# Patient Record
Sex: Male | Born: 1970
Health system: Southern US, Community
[De-identification: ages and names within clinical notes are randomized; demographics above are authoritative.]

## PROBLEM LIST (undated history)

## (undated) DIAGNOSIS — E785 Hyperlipidemia, unspecified: Secondary | ICD-10-CM

## (undated) HISTORY — DX: Hyperlipidemia, unspecified: E78.5

---

## 1998-11-30 ENCOUNTER — Other Ambulatory Visit: Admission: RE | Admit: 1998-11-30 | Discharge: 1998-11-30 | Payer: Self-pay | Admitting: Otolaryngology

## 1998-11-30 ENCOUNTER — Encounter (INDEPENDENT_AMBULATORY_CARE_PROVIDER_SITE_OTHER): Payer: Self-pay | Admitting: Specialist

## 2007-07-18 ENCOUNTER — Emergency Department (HOSPITAL_COMMUNITY): Admission: EM | Admit: 2007-07-18 | Discharge: 2007-07-19 | Payer: Self-pay | Admitting: Emergency Medicine

## 2010-06-16 ENCOUNTER — Other Ambulatory Visit (INDEPENDENT_AMBULATORY_CARE_PROVIDER_SITE_OTHER): Payer: Self-pay | Admitting: Internal Medicine

## 2010-06-16 ENCOUNTER — Encounter (HOSPITAL_BASED_OUTPATIENT_CLINIC_OR_DEPARTMENT_OTHER): Payer: BC Managed Care – PPO | Admitting: Internal Medicine

## 2010-06-16 ENCOUNTER — Ambulatory Visit (HOSPITAL_COMMUNITY)
Admission: RE | Admit: 2010-06-16 | Discharge: 2010-06-16 | Disposition: A | Discharge status: Home / Self Care | Payer: BC Managed Care – PPO | Source: Ambulatory Visit | Attending: Internal Medicine | Admitting: Internal Medicine

## 2010-06-16 DIAGNOSIS — K5120 Ulcerative (chronic) proctitis without complications: Secondary | ICD-10-CM

## 2010-06-16 DIAGNOSIS — K921 Melena: Secondary | ICD-10-CM | POA: Insufficient documentation

## 2010-06-16 DIAGNOSIS — K625 Hemorrhage of anus and rectum: Secondary | ICD-10-CM

## 2010-06-16 DIAGNOSIS — K5289 Other specified noninfective gastroenteritis and colitis: Secondary | ICD-10-CM | POA: Insufficient documentation

## 2010-06-20 ENCOUNTER — Ambulatory Visit (INDEPENDENT_AMBULATORY_CARE_PROVIDER_SITE_OTHER): Payer: Self-pay | Admitting: Internal Medicine

## 2010-07-09 NOTE — Op Note (Signed)
  NAME:  Darrell Morales, Darrell Morales             ACCOUNT NO.:  [id number]  MEDICAL RECORD NO.:  [id number]           PATIENT TYPE:  O  LOCATION:  [place]                          FACILITY:  [facility]  PHYSICIAN:  Lionel December, M.D.    DATE OF BIRTH:  1971-04-11  DATE OF PROCEDURE:  06/16/2010 DATE OF DISCHARGE:                              OPERATIVE REPORT   PROCEDURE:  Colonoscopy with terminal ileoscopy.  INDICATION:  [name] is a 40 year old Caucasian male who has been noticing blood in his bowel movements for about 4 weeks.  Lately he has passed large amount of blood.  He has had some diarrhea.  He denies anorexia, weight loss, or abdominal pain.  He does not take any NSAIDs.  Family history is negative for inflammatory bowel disease.  Procedure risks were reviewed with the patient and informed consent was obtained.  MEDICATIONS FOR CONSCIOUS SEDATION:  Demerol 50 mg IV and Versed 5 mg IV.  FINDINGS:  Procedure performed in endoscopy suite.  The patient's vital signs and O2 sat were monitored during the procedure and remained stable.  The patient was placed in left lateral recumbent position. Rectal examination was performed.  No abnormality noted on external or digital exam.  Pentax videoscope was placed through rectum where mucosa was removed noted to be diffusely abnormal with loss of vascularity, erythema, ulcers, and erosions.  There was a virtual complete involvement of rectal mucosa with a clear demarcation just below the rectosigmoid junction.  Scope was advanced into sigmoid colon and beyond.  Rest of the mucosa was normal.  Preparation was excellent. Pictures taken of appendiceal orifice and ileocecal valve.  Short segment of GI was also examined and was normal.  As the scope was withdrawn, colonic mucosa was once again carefully examined and only abnormality was involving the rectal mucosa.  Scope was retroflexed to examine anorectal junction which was unremarkable.  Multiple  biopsies were taken for routine histology from rectal mucosa and endoscope was withdrawn.  Withdrawal time was 10 minutes.  The patient tolerated the procedure well.  FINAL DIAGNOSES: 1. Ulcerative proctitis. 2. Normal terminal ileum.  RECOMMENDATIONS: 1. Pentasa suppository 1 g per rectum at bedtime.  Prescription given     for 30 with 5 refills. 2. I will be contacting the patient with results of biopsy and plan to     see him in the office in 4 weeks at which time we will discuss     whether or not he should be on oral mesalamine.     Lionel December, M.D.     NR/MEDQ  D:  06/16/2010  T:  06/16/2010  Job:  [id number]  cc:   Selinda Flavin Fax: [phone]  Electronically Signed by Lionel December M.D. on 07/09/2010 01:17:32 PM

## 2010-07-18 ENCOUNTER — Ambulatory Visit (INDEPENDENT_AMBULATORY_CARE_PROVIDER_SITE_OTHER): Payer: BC Managed Care – PPO | Admitting: Internal Medicine

## 2010-07-18 DIAGNOSIS — K6289 Other specified diseases of anus and rectum: Secondary | ICD-10-CM

## 2010-07-31 NOTE — Consult Note (Signed)
  NAME:  Darrell Morales, Darrell Morales             ACCOUNT NO.:  [id number]  MEDICAL RECORD NO.:  [id number]           PATIENT TYPE: AMB.  LOCATION: [place].                   [facility]  PHYSICIAN:  Lionel December, M.D.    DATE OF BIRTH:  Sep 23, 1970  DATE :  07/18/2010                                OFFICE VISIT.   PRESENTING COMPLAINT:  Followup for ulcerative proctitis.  SUBJECTIVE:  Darrell Morales is a 40 year old Caucasian male patient of Dr. Selinda Flavin who underwent colonoscopy on June 16, 2010, does have 82-month history of rectal bleeding and change in his bowel habits.  His terminal ileum was normal.  He had ulcerative proctitis.  Biopsy showed typical changes of ulcerative colitis.  He was begun on Canasa suppositories. He has some difficulty keeping them in and retaining them early on, but lately he has done well.  He states all of his symptoms have resolved. He is not passing any mucus or blood per rectum.  His bowels are now normal and he is having minimal flatulence.  He is not having any side effects with therapy.  He is interested in taking a pill rather than suppository.  CURRENT MEDICATIONS: 1. Zyrtec 10 mg daily p.r.n. 2. Canasa suppository 1 g per rectum nightly.  OBJECTIVE:  VITAL SIGNS:  Weight 139.5 pounds, he is 68 inches tall, pulse 78 per minute, blood pressure 120/80 and temp is 97.8. EYES:  Conjunctivae and nail beds are pink.  Sclerae are nonicteric. NECK:  No neck masses are noted. ABDOMEN:  Flat, soft and nontender without organomegaly or masses.  ASSESSMENT:  Ulcerative proctitis or ulcerative colitis limited to the rectum.  He appears to be back in remission.  He will either be maintained on Canasa suppositories at less frequent intervals for maintenance or we could start him on oral mesalamine.  He would like to try the later for obvious reasons.  PLAN:  He will continue Canasa suppository daily for another 1 month and then stop.  We will start him  on Lialda 2.4 g p.o. b.i.d. samples given.  If he has no side effects, we will call in a prescription.  Unless he has problems, he will return for OV in 6 months.     Lionel December, M.D.     NR/MEDQ  D:  07/18/2010  T:  07/19/2010  Job:  [id number]  cc:   Selinda Flavin Fax: [phone]  Electronically Signed by Lionel December M.D. on 07/30/2010 11:40:05 PM

## 2010-12-14 ENCOUNTER — Encounter (INDEPENDENT_AMBULATORY_CARE_PROVIDER_SITE_OTHER): Payer: Self-pay | Admitting: *Deleted

## 2011-01-09 LAB — CBC
HCT: 43.9
Hemoglobin: 15.9
WBC: 4.7

## 2011-01-09 LAB — RAPID URINE DRUG SCREEN, HOSP PERFORMED
Amphetamines: NOT DETECTED
Benzodiazepines: NOT DETECTED

## 2011-01-09 LAB — DIFFERENTIAL
Eosinophils Relative: 3
Lymphocytes Relative: 49 — ABNORMAL HIGH
Lymphs Abs: 2.3
Monocytes Absolute: 0.4
Monocytes Relative: 8

## 2011-01-09 LAB — BASIC METABOLIC PANEL
GFR calc non Af Amer: 59 — ABNORMAL LOW
Glucose, Bld: 95
Potassium: 3.6
Sodium: 137

## 2011-02-19 ENCOUNTER — Ambulatory Visit (INDEPENDENT_AMBULATORY_CARE_PROVIDER_SITE_OTHER): Payer: BC Managed Care – PPO | Admitting: Internal Medicine

## 2015-12-06 DIAGNOSIS — H52223 Regular astigmatism, bilateral: Secondary | ICD-10-CM | POA: Diagnosis not present

## 2016-05-21 DIAGNOSIS — Z0389 Encounter for observation for other suspected diseases and conditions ruled out: Secondary | ICD-10-CM | POA: Diagnosis not present

## 2016-05-21 DIAGNOSIS — R5383 Other fatigue: Secondary | ICD-10-CM | POA: Diagnosis not present

## 2016-05-21 DIAGNOSIS — Z0000 Encounter for general adult medical examination without abnormal findings: Secondary | ICD-10-CM | POA: Diagnosis not present

## 2016-05-21 DIAGNOSIS — R6882 Decreased libido: Secondary | ICD-10-CM | POA: Diagnosis not present

## 2017-07-20 ENCOUNTER — Emergency Department (HOSPITAL_COMMUNITY): Payer: No Typology Code available for payment source

## 2017-07-20 ENCOUNTER — Emergency Department (HOSPITAL_COMMUNITY)
Admission: EM | Admit: 2017-07-20 | Discharge: 2017-07-20 | Disposition: A | Discharge status: Home / Self Care | Payer: No Typology Code available for payment source | Attending: Emergency Medicine | Admitting: Emergency Medicine

## 2017-07-20 ENCOUNTER — Encounter (HOSPITAL_COMMUNITY): Payer: Self-pay | Admitting: Emergency Medicine

## 2017-07-20 ENCOUNTER — Other Ambulatory Visit: Payer: Self-pay

## 2017-07-20 DIAGNOSIS — R112 Nausea with vomiting, unspecified: Secondary | ICD-10-CM | POA: Diagnosis not present

## 2017-07-20 DIAGNOSIS — N200 Calculus of kidney: Secondary | ICD-10-CM | POA: Diagnosis not present

## 2017-07-20 DIAGNOSIS — Z79899 Other long term (current) drug therapy: Secondary | ICD-10-CM | POA: Diagnosis not present

## 2017-07-20 DIAGNOSIS — R1031 Right lower quadrant pain: Secondary | ICD-10-CM | POA: Diagnosis present

## 2017-07-20 LAB — URINALYSIS, ROUTINE W REFLEX MICROSCOPIC
BILIRUBIN URINE: NEGATIVE
Glucose, UA: NEGATIVE mg/dL
HGB URINE DIPSTICK: NEGATIVE
KETONES UR: 20 mg/dL — AB
Leukocytes, UA: NEGATIVE
NITRITE: NEGATIVE
PROTEIN: NEGATIVE mg/dL
Specific Gravity, Urine: >1.046 — ABNORMAL HIGH (ref 1.005–1.030)
pH: 7 (ref 5.0–8.0)

## 2017-07-20 LAB — CBC WITH DIFFERENTIAL/PLATELET
Basophils Absolute: 0 10*3/uL (ref 0.0–0.1)
Basophils Relative: 0 %
Eosinophils Absolute: 0.1 10*3/uL (ref 0.0–0.7)
Eosinophils Relative: 1 %
HEMATOCRIT: 45.6 % (ref 39.0–52.0)
Hemoglobin: 16 g/dL (ref 13.0–17.0)
Lymphocytes Relative: 15 %
Lymphs Abs: 1.4 10*3/uL (ref 0.7–4.0)
MCH: 32.2 pg (ref 26.0–34.0)
MCHC: 35.1 g/dL (ref 30.0–36.0)
MCV: 91.8 fL (ref 78.0–100.0)
MONO ABS: 0.3 10*3/uL (ref 0.1–1.0)
MONOS PCT: 4 %
NEUTROS ABS: 7.3 10*3/uL (ref 1.7–7.7)
NEUTROS PCT: 80 %
PLATELETS: 216 10*3/uL (ref 150–400)
RBC: 4.97 MIL/uL (ref 4.22–5.81)
RDW: 12.8 % (ref 11.5–15.5)
WBC: 9 10*3/uL (ref 4.0–10.5)

## 2017-07-20 LAB — COMPREHENSIVE METABOLIC PANEL
ALK PHOS: 70 U/L (ref 38–126)
ALT: 30 U/L (ref 17–63)
AST: 30 U/L (ref 15–41)
Albumin: 4.8 g/dL (ref 3.5–5.0)
Anion gap: 13 (ref 5–15)
BILIRUBIN TOTAL: 1 mg/dL (ref 0.3–1.2)
BUN: 20 mg/dL (ref 6–20)
CALCIUM: 10 mg/dL (ref 8.9–10.3)
CO2: 25 mmol/L (ref 22–32)
Chloride: 101 mmol/L (ref 101–111)
Creatinine, Ser: 1.4 mg/dL — ABNORMAL HIGH (ref 0.61–1.24)
GFR, EST NON AFRICAN AMERICAN: 60 mL/min — AB (ref >60)
Glucose, Bld: 138 mg/dL — ABNORMAL HIGH (ref 65–99)
Potassium: 4.4 mmol/L (ref 3.5–5.1)
Sodium: 139 mmol/L (ref 135–145)
TOTAL PROTEIN: 8 g/dL (ref 6.5–8.1)

## 2017-07-20 MED ORDER — ONDANSETRON 4 MG PO TBDP: 4.0000 mg | ORAL_TABLET | Freq: Three times a day (TID) | ORAL | 0 refills | Status: DC | PRN

## 2017-07-20 MED ORDER — TAMSULOSIN HCL 0.4 MG PO CAPS: 0.4000 mg | ORAL_CAPSULE | Freq: Every day | ORAL | 0 refills | Status: DC

## 2017-07-20 MED ORDER — KETOROLAC TROMETHAMINE 30 MG/ML IJ SOLN
30.0000 mg | Freq: Once | INTRAMUSCULAR | Status: AC
  Administered 2017-07-20: 30 mg via INTRAVENOUS
  Filled 2017-07-20: qty 1

## 2017-07-20 MED ORDER — TAMSULOSIN HCL 0.4 MG PO CAPS
0.4000 mg | ORAL_CAPSULE | Freq: Once | ORAL | Status: AC
  Administered 2017-07-20: 0.4 mg via ORAL
  Filled 2017-07-20: qty 1

## 2017-07-20 MED ORDER — SODIUM CHLORIDE 0.9 % IV BOLUS
1000.0000 mL | Freq: Once | INTRAVENOUS | Status: AC
  Administered 2017-07-20: 1000 mL via INTRAVENOUS

## 2017-07-20 MED ORDER — FENTANYL CITRATE (PF) 100 MCG/2ML IJ SOLN
50.0000 ug | Freq: Once | INTRAMUSCULAR | Status: AC
  Administered 2017-07-20: 50 ug via INTRAVENOUS
  Filled 2017-07-20: qty 2

## 2017-07-20 MED ORDER — ONDANSETRON HCL 4 MG/2ML IJ SOLN
4.0000 mg | Freq: Once | INTRAMUSCULAR | Status: AC
  Administered 2017-07-20: 4 mg via INTRAVENOUS
  Filled 2017-07-20: qty 2

## 2017-07-20 MED ORDER — HYDROMORPHONE HCL 1 MG/ML IJ SOLN
0.5000 mg | Freq: Once | INTRAMUSCULAR | Status: AC
  Administered 2017-07-20: 0.5 mg via INTRAVENOUS
  Filled 2017-07-20: qty 1

## 2017-07-20 MED ORDER — OXYCODONE-ACETAMINOPHEN 5-325 MG PO TABS: 1.0000 | ORAL_TABLET | ORAL | 0 refills | Status: DC | PRN

## 2017-07-20 MED ORDER — MORPHINE SULFATE (PF) 4 MG/ML IV SOLN
4.0000 mg | Freq: Once | INTRAVENOUS | Status: AC
  Administered 2017-07-20: 4 mg via INTRAVENOUS
  Filled 2017-07-20: qty 1

## 2017-07-20 MED ORDER — IOPAMIDOL (ISOVUE-300) INJECTION 61%
100.0000 mL | Freq: Once | INTRAVENOUS | Status: AC | PRN
  Administered 2017-07-20: 100 mL via INTRAVENOUS

## 2017-07-20 MED ORDER — OXYCODONE-ACETAMINOPHEN 5-325 MG PO TABS
1.0000 | ORAL_TABLET | Freq: Once | ORAL | Status: AC
Start: 2017-07-20 — End: 2017-07-20
  Administered 2017-07-20: 1 via ORAL
  Filled 2017-07-20: qty 1

## 2017-07-20 NOTE — ED Provider Notes (Signed)
[facility] EMERGENCY DEPARTMENT Provider Note   CSN: [id number] Arrival date & time: 07/20/17  [id number]     History   Chief Complaint Chief Complaint  Patient presents with  . Abdominal Pain    HPI Gwenlyn PerkingRoger W Aikens is a 47 y.o. male with no significant past medical history, although chart reveals distant past hx of ulcerative proctitis (pt and wife endorse not a chronic condition) presenting with severe right lower abdominal pain which woke him at 6:30 today.  He reports severe sharp pain with nausea and emesis x 1 prior to arrival.  He denies diarrhea, dysuria, hematuria and back or flank pain.  He has had no treatment prior to arrival.  The history is provided by the patient.    History reviewed. No pertinent past medical history.  There are no active problems to display for this patient.   History reviewed. No pertinent surgical history.      Home Medications    Prior to Admission medications   Medication Sig Start Date End Date Taking? Authorizing Provider  ondansetron (ZOFRAN ODT) 4 MG disintegrating tablet Take 1 tablet (4 mg total) by mouth every 8 (eight) hours as needed for nausea or vomiting. 07/20/17   Burgess AmorIdol, Kayleena Eke, PA-C  oxyCODONE-acetaminophen (PERCOCET/ROXICET) 5-325 MG tablet Take 1 tablet by mouth every 4 (four) hours as needed. 07/20/17   Burgess AmorIdol, Nakyla Bracco, PA-C  tamsulosin (FLOMAX) 0.4 MG CAPS capsule Take 1 capsule (0.4 mg total) by mouth daily after supper. 07/20/17   Burgess AmorIdol, Yanis Juma, PA-C    Family History History reviewed. No pertinent family history.  Social History Social History   Tobacco Use  . Smoking status: Unknown If Ever Smoked  Substance Use Topics  . Alcohol use: Yes    Comment: occ  . Drug use: Never     Allergies   Patient has no known allergies.   Review of Systems Review of Systems  Constitutional: Negative for chills and fever.  HENT: Negative for congestion and sore throat.   Eyes: Negative.   Respiratory: Negative for chest  tightness and shortness of breath.   Cardiovascular: Negative for chest pain.  Gastrointestinal: Positive for abdominal pain, nausea and vomiting.  Genitourinary: Negative.   Musculoskeletal: Negative for arthralgias, joint swelling and neck pain.  Skin: Negative.  Negative for rash and wound.  Neurological: Negative for dizziness, weakness, light-headedness, numbness and headaches.  Psychiatric/Behavioral: Negative.      Physical Exam Updated Vital Signs BP 118/85 (BP Location: Left Arm)   Pulse (!) 57   Resp 14   Ht 5\' 8"  (1.727 m)   Wt 78 kg (172 lb)   SpO2 98%   BMI 26.15 kg/m   Physical Exam  Constitutional: He appears well-developed and well-nourished.  HENT:  Head: Normocephalic and atraumatic.  Eyes: Conjunctivae are normal.  Neck: Normal range of motion.  Cardiovascular: Normal rate, regular rhythm, normal heart sounds and intact distal pulses.  Pulmonary/Chest: Effort normal and breath sounds normal. He has no wheezes.  Abdominal: Soft. Bowel sounds are normal. He exhibits no mass. There is tenderness in the right lower quadrant. There is no rigidity, no guarding and no CVA tenderness. No hernia.  Musculoskeletal: Normal range of motion.  Neurological: He is alert.  Skin: Skin is warm and dry.  Psychiatric: He has a normal mood and affect.  Nursing note and vitals reviewed.    ED Treatments / Results  Labs (all labs ordered are listed, but only abnormal results are displayed) Labs Reviewed  COMPREHENSIVE  METABOLIC PANEL - Abnormal; Notable for the following components:      Result Value   Glucose, Bld 138 (*)    Creatinine, Ser 1.40 (*)    GFR calc non Af Amer 60 (*)    All other components within normal limits  URINALYSIS, ROUTINE W REFLEX MICROSCOPIC - Abnormal; Notable for the following components:   Specific Gravity, Urine >1.046 (*)    Ketones, ur 20 (*)    All other components within normal limits  CBC WITH DIFFERENTIAL/PLATELET     EKG None  Radiology Ct Abdomen Pelvis W Contrast  Result Date: 07/20/2017 CLINICAL DATA:  47 year old male with right lower quadrant pain, nausea and vomiting EXAM: CT ABDOMEN AND PELVIS WITH CONTRAST TECHNIQUE: Multidetector CT imaging of the abdomen and pelvis was performed using the standard protocol following bolus administration of intravenous contrast. CONTRAST:  ISOVUE-300 IOPAMIDOL (ISOVUE-300) INJECTION 61% COMPARISON:  None. FINDINGS: Lower chest: The lung bases are clear. Visualized cardiac structures are within normal limits for size. No pericardial effusion. Unremarkable visualized distal thoracic esophagus. Hepatobiliary: Normal hepatic contour and morphology. No discrete hepatic lesions. Normal appearance of the gallbladder. No intra or extrahepatic biliary ductal dilatation. Pancreas: Unremarkable. No pancreatic ductal dilatation or surrounding inflammatory changes. Spleen: Normal in size without focal abnormality. Adrenals/Urinary Tract: Normal adrenal glands bilaterally. No evidence of enhancing renal mass. Multiple small stones are present in the collecting systems bilaterally. There are at least 6 stones on the right with the largest measuring 4 mm in the lower pole. On the left, there are at least 2 punctate stones. Very mild hydronephrosis on the right with inflammatory stranding around the UPJ and along the course of the mildly dilated ureter. This is secondary to a 4 mm stone in the right UVJ. 2.6 cm simple cyst in the lower pole of the left kidney. Stomach/Bowel: Stomach is within normal limits. Appendix appears normal. No evidence of bowel wall thickening, distention, or inflammatory changes. Vascular/Lymphatic: No significant vascular findings are present. No enlarged abdominal or pelvic lymph nodes. Reproductive: Prostate is unremarkable. Other: No abdominal wall hernia or abnormality. No abdominopelvic ascites. Musculoskeletal: No acute or significant osseous findings.  IMPRESSION: 1. At least partially obstructing 4 mm stone in the right ureterovesicular junction resulting in mild hydroureteronephrosis and periureteric inflammatory stranding. 2. Right-sided nephrolithiasis with an additional 6 stones in the right renal collecting system ranging in size from 2-4 mm. At least 2 punctate stones are present in the left renal collecting system as well. Electronically Signed   By: Malachy Moan M.D.   On: 07/20/2017 10:43    Procedures Procedures (including critical care time)  Medications Ordered in ED Medications  morphine 4 MG/ML injection 4 mg (4 mg Intravenous Given 07/20/17 0932)  ondansetron (ZOFRAN) injection 4 mg (4 mg Intravenous Given 07/20/17 0932)  sodium chloride 0.9 % bolus 1,000 mL (0 mLs Intravenous Stopped 07/20/17 1034)  HYDROmorphone (DILAUDID) injection 0.5 mg (0.5 mg Intravenous Given 07/20/17 0953)  iopamidol (ISOVUE-300) 61 % injection 100 mL (100 mLs Intravenous Contrast Given 07/20/17 1011)  fentaNYL (SUBLIMAZE) injection 50 mcg (50 mcg Intravenous Given 07/20/17 1033)  ketorolac (TORADOL) 30 MG/ML injection 30 mg (30 mg Intravenous Given 07/20/17 1048)  tamsulosin (FLOMAX) capsule 0.4 mg (0.4 mg Oral Given 07/20/17 1123)  oxyCODONE-acetaminophen (PERCOCET/ROXICET) 5-325 MG per tablet 1 tablet (1 tablet Oral Given 07/20/17 1256)  ondansetron (ZOFRAN) injection 4 mg (4 mg Intravenous Given 07/20/17 1256)     Initial Impression / Assessment and Plan /  ED Course  I have reviewed the triage vital signs and the nursing notes.  Pertinent labs & imaging results that were available during my care of the patient were reviewed by me and considered in my medical decision making (see chart for details).     Pt with 4 mm right UVJ stone with mild hydronephrosis. He obtained pain relief after receiving toradol, narcotic meds did not improve pain.  He was given urine strainer, flomax started. Zofran, oxycodone prn. Referral to urology for f/u care.  Discussed  return precautions. Questions answered.  Final Clinical Impressions(s) / ED Diagnoses   Final diagnoses:  Kidney stone    ED Discharge Orders        Ordered    oxyCODONE-acetaminophen (PERCOCET/ROXICET) 5-325 MG tablet  Every 4 hours PRN     07/20/17 1206    ondansetron (ZOFRAN ODT) 4 MG disintegrating tablet  Every 8 hours PRN     07/20/17 1206    tamsulosin (FLOMAX) 0.4 MG CAPS capsule  Daily after supper     07/20/17 1206       Burgess Amor, PA-C 07/20/17 1424    Eber Hong, MD 07/21/17 [phone]

## 2017-07-20 NOTE — ED Triage Notes (Signed)
Pt reports RLQ pain since waking up this am. Pt reports nausea and emesis, denies fever. Pt pacing in room.

## 2017-07-20 NOTE — Discharge Instructions (Addendum)
Make sure you are straining your urine and save the stone if it passes to show the urologist.  You may take the oxycodone prescribed for pain relief.  This will make you drowsy - do not drive within 4 hours of taking this medication. Make sure you are drinking plenty of fluids. Take your next dose of flomax tomorrow evening.  You can stop taking this medicine as soon as this stone passes.

## 2017-07-26 ENCOUNTER — Ambulatory Visit: Payer: No Typology Code available for payment source | Admitting: Urology

## 2017-07-26 DIAGNOSIS — N202 Calculus of kidney with calculus of ureter: Secondary | ICD-10-CM | POA: Diagnosis not present

## 2019-06-17 ENCOUNTER — Encounter (INDEPENDENT_AMBULATORY_CARE_PROVIDER_SITE_OTHER): Payer: No Typology Code available for payment source | Admitting: Internal Medicine

## 2019-09-09 ENCOUNTER — Ambulatory Visit (INDEPENDENT_AMBULATORY_CARE_PROVIDER_SITE_OTHER): Payer: No Typology Code available for payment source | Admitting: Internal Medicine

## 2019-09-09 ENCOUNTER — Encounter (INDEPENDENT_AMBULATORY_CARE_PROVIDER_SITE_OTHER): Payer: Self-pay | Admitting: Internal Medicine

## 2019-09-09 ENCOUNTER — Other Ambulatory Visit: Payer: Self-pay

## 2019-09-09 VITALS — BP 138/89 | HR 68 | Temp 97.5°F | Resp 18 | Ht 69.0 in | Wt 177.6 lb

## 2019-09-09 DIAGNOSIS — E663 Overweight: Secondary | ICD-10-CM | POA: Diagnosis not present

## 2019-09-09 DIAGNOSIS — E782 Mixed hyperlipidemia: Secondary | ICD-10-CM

## 2019-09-09 DIAGNOSIS — R5383 Other fatigue: Secondary | ICD-10-CM

## 2019-09-09 DIAGNOSIS — E559 Vitamin D deficiency, unspecified: Secondary | ICD-10-CM

## 2019-09-09 DIAGNOSIS — Z0001 Encounter for general adult medical examination with abnormal findings: Secondary | ICD-10-CM | POA: Diagnosis not present

## 2019-09-09 DIAGNOSIS — R5381 Other malaise: Secondary | ICD-10-CM | POA: Diagnosis not present

## 2019-09-09 NOTE — Progress Notes (Signed)
Chief Complaint: This 49 year old man comes in for an annual physical exam. HPI: He has no specific complaints today. He does have a history of hyperlipidemia and he also has a history of early coronary artery disease in his father who had a myocardial infarction he thinks at the age of 61 but is not quite sure. He has a job which is somewhat stressful.  Past Medical History:  Diagnosis Date  . Hyperlipidemia    History reviewed. No pertinent surgical history.   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    Social History   Tobacco Use  . Smoking status: Never Smoker  . Smokeless tobacco: Former Network engineer Use Topics  . Alcohol use: Yes    Alcohol/week: 14.0 standard drinks    Types: 14 Cans of beer per week      Allergies: No Known Allergies   No outpatient medications have been marked as taking for the 09/09/19 encounter (Office Visit) with Doree Albee, MD.       Depression screen [facility] 2/9 09/09/2019  Decreased Interest 0  Down, Depressed, Hopeless 0  PHQ - 2 Score 0     YSA:YTKZS from the symptoms mentioned above,there are no other symptoms referable to all systems reviewed.       Physical Exam: Blood pressure 138/89, pulse 68, temperature (!) 97.5 F (36.4 C), temperature source Temporal, resp. rate 18, height 5\' 9"  (1.753 m), weight 177 lb 9.6 oz (80.6 kg), SpO2 96 %. Vitals with BMI 09/09/2019 07/20/2017 07/20/2017  Height 5\' 9"  - -  Weight 177 lbs 10 oz - -  BMI 01.09 - -  Systolic 323 557 322  Diastolic 89 85 65  Pulse 68 57 59      He looks systemically well.  He is overweight.  Blood pressure elevated today but he was rushing to come to the office visit. General: Alert, cooperative, and appears to be the stated age.No pallor.  No jaundice.  No clubbing. Head: Normocephalic Eyes: Sclera white, pupils equal and reactive to light, red reflex x 2,    Ears: Normal bilaterally Oral cavity: Lips, mucosa, and tongue normal: Teeth and gums normal Neck: No adenopathy, supple, symmetrical, trachea midline, and thyroid does not appear enlarged Respiratory: Clear to auscultation bilaterally.No wheezing, crackles or bronchial breathing. Cardiovascular: Heart sounds are present and appear to be normal without murmurs or added sounds.  No carotid bruits.  Peripheral pulses are present and equal bilaterally.: Gastrointestinal:positive bowel sounds, no hepatosplenomegaly.  No masses felt.No tenderness. Skin: Clear, No rashes noted.No worrisome skin lesions seen. Neurological: Grossly intact without focal findings, cranial nerves II through XII intact, muscle strength equal bilaterally Musculoskeletal: No acute joint abnormalities noted.Full range of movement noted with joints. Psychiatric: Affect appropriate, non-anxious.    Assessment  1. Mixed hyperlipidemia   2. Overweight (BMI 25.0-29.9)   3. Encounter for general adult medical examination with abnormal findings   4. Malaise and fatigue   5. Vitamin D deficiency disease     Tests Ordered:   Orders Placed This Encounter  Procedures  . CBC  . Cardio IQ Adv Lipid and Inflamm Pnl  . T3, free  . T4  . TSH  . VITAMIN D 25 Hydroxy (Vit-D Deficiency, Fractures)  . Other/Misc lab test     Plan  1. Blood work is ordered above. 2. In view of the family history of early coronary artery disease, I  am going to order a cardio IQ lipid panel as well as insulin resistance panel for further evaluation.  I told the patient is very important that we try and prevent any thing that might happen to him in the future such as coronary artery disease as he has risk towards it. 3. Further recommendations will depend on results and I will review this with him in a few weeks time.     No orders of the defined types were placed in this encounter.    Kishawn Pickar C Alistair Senft   09/09/2019, 9:27 AM

## 2019-09-15 LAB — CARDIO IQ ADV LIPID AND INFLAMM PNL
Apolipoprotein B: 113 mg/dL — ABNORMAL HIGH (ref <90)
Cholesterol: 250 mg/dL — ABNORMAL HIGH (ref <200)
HDL: 92 mg/dL (ref >39)
LDL Cholesterol (Calc): 144 mg/dL (calc) — ABNORMAL HIGH (ref <100)
LDL Large: 8102 nmol/L (ref >6729)
LDL Medium: 398 nmol/L — ABNORMAL HIGH (ref <215)
LDL Particle Number: 2296 nmol/L — ABNORMAL HIGH (ref <1138)
LDL Peak Size: 224.8 Angstrom (ref >222.9)
LDL Small: 337 nmol/L — ABNORMAL HIGH (ref <142)
Lipoprotein (a): 25 nmol/L (ref <75)
Non-HDL Cholesterol (Calc): 158 mg/dL (calc) — ABNORMAL HIGH (ref <130)
PLAC: 116 nmol/min/mL (ref <124)
Total CHOL/HDL Ratio: 2.7 calc (ref <3.6)
Triglycerides: 47 mg/dL (ref <150)
hs-CRP: 0.6 mg/L (ref <1.0)

## 2019-09-15 LAB — TSH: TSH: 1.42 mIU/L (ref 0.40–4.50)

## 2019-09-15 LAB — CARDIO IQ INSULIN RESISTANCE PANEL WITH SCORE
C-PEPTIDE, LC/MS/MS: 1.63 ng/mL (ref 0.68–2.16)
INSULIN, INTACT, LC/MS/MS: <3 u[IU]/mL (ref <=16)
Insulin Resistance Score: 9 (ref <=66)

## 2019-09-15 LAB — CBC
HCT: 44.4 % (ref 38.5–50.0)
Hemoglobin: 15.3 g/dL (ref 13.2–17.1)
MCH: 32.2 pg (ref 27.0–33.0)
MCHC: 34.5 g/dL (ref 32.0–36.0)
MCV: 93.5 fL (ref 80.0–100.0)
MPV: 9.5 fL (ref 7.5–12.5)
Platelets: 230 10*3/uL (ref 140–400)
RBC: 4.75 10*6/uL (ref 4.20–5.80)
RDW: 12.5 % (ref 11.0–15.0)
WBC: 4 10*3/uL (ref 3.8–10.8)

## 2019-09-15 LAB — T3, FREE: T3, Free: 3.2 pg/mL (ref 2.3–4.2)

## 2019-09-15 LAB — VITAMIN D 25 HYDROXY (VIT D DEFICIENCY, FRACTURES): Vit D, 25-Hydroxy: 37 ng/mL (ref 30–100)

## 2019-09-15 LAB — T4: T4, Total: 5.1 ug/dL (ref 4.9–10.5)

## 2019-10-07 ENCOUNTER — Ambulatory Visit (INDEPENDENT_AMBULATORY_CARE_PROVIDER_SITE_OTHER): Payer: No Typology Code available for payment source | Admitting: Internal Medicine

## 2019-12-08 ENCOUNTER — Telehealth (INDEPENDENT_AMBULATORY_CARE_PROVIDER_SITE_OTHER): Payer: Self-pay

## 2020-05-16 NOTE — Telephone Encounter (Signed)
Closed

## 2020-11-17 ENCOUNTER — Encounter (INDEPENDENT_AMBULATORY_CARE_PROVIDER_SITE_OTHER): Payer: Self-pay

## 2021-05-11 ENCOUNTER — Other Ambulatory Visit: Payer: Self-pay

## 2021-05-11 DIAGNOSIS — N200 Calculus of kidney: Secondary | ICD-10-CM

## 2021-05-12 ENCOUNTER — Encounter: Payer: Self-pay | Admitting: Urology

## 2021-05-12 ENCOUNTER — Ambulatory Visit (INDEPENDENT_AMBULATORY_CARE_PROVIDER_SITE_OTHER): Payer: No Typology Code available for payment source | Admitting: Urology

## 2021-05-12 ENCOUNTER — Ambulatory Visit (HOSPITAL_COMMUNITY)
Admission: RE | Admit: 2021-05-12 | Discharge: 2021-05-12 | Disposition: A | Discharge status: Home / Self Care | Payer: No Typology Code available for payment source | Source: Ambulatory Visit | Attending: Urology | Admitting: Urology

## 2021-05-12 ENCOUNTER — Other Ambulatory Visit: Payer: Self-pay

## 2021-05-12 VITALS — BP 126/78 | HR 62

## 2021-05-12 DIAGNOSIS — N200 Calculus of kidney: Secondary | ICD-10-CM

## 2021-05-12 DIAGNOSIS — M549 Dorsalgia, unspecified: Secondary | ICD-10-CM

## 2021-05-12 DIAGNOSIS — R1012 Left upper quadrant pain: Secondary | ICD-10-CM | POA: Diagnosis not present

## 2021-05-12 DIAGNOSIS — R109 Unspecified abdominal pain: Secondary | ICD-10-CM | POA: Insufficient documentation

## 2021-05-12 LAB — URINALYSIS, ROUTINE W REFLEX MICROSCOPIC
Bilirubin, UA: NEGATIVE
Glucose, UA: NEGATIVE
Ketones, UA: NEGATIVE
Leukocytes,UA: NEGATIVE
Nitrite, UA: NEGATIVE
Protein,UA: NEGATIVE
RBC, UA: NEGATIVE
Specific Gravity, UA: 1.015 (ref 1.005–1.030)
Urobilinogen, Ur: 0.2 mg/dL (ref 0.2–1.0)
pH, UA: 7 (ref 5.0–7.5)

## 2021-05-12 LAB — BLADDER SCAN AMB NON-IMAGING: Scan Result: 0

## 2021-05-12 MED ORDER — TAMSULOSIN HCL 0.4 MG PO CAPS: 0.4000 mg | ORAL_CAPSULE | Freq: Every day | ORAL | 3 refills | Status: AC

## 2021-05-12 MED ORDER — CYCLOBENZAPRINE HCL 5 MG PO TABS: 5.0000 mg | ORAL_TABLET | Freq: Three times a day (TID) | ORAL | 1 refills | Status: AC | PRN

## 2021-05-12 NOTE — Progress Notes (Signed)
05/12/2021 12:56 PM   Johnnette Barrios 1970-06-20 OI:[id number]  Referring provider: No referring provider defined for this encounter.  CC: Right flank pain  HPI: Mr Darrell Morales is a 51YO male with h/o nephrolithiasis who present with c/o 1 week h/o weakness of urine stream and right sided flank pain. He admits to occasional incomplete emptying, frequency, and intermittency ongoing. He voids 1 time/night. No hematuria, burning, urgency, dysuria. He denies h/o STI, UTI, ED. Pt drinks 6-7 beers/daily, 1 cup of coffee in the mornings. No sodas. He states he has constant intake of H2O throughout the day. The pt went for CT stone study following initial eval today and states his weakness of stream resolved between earlier eval and now.  UA= normal IPPS=8, QOL=3 PVR=0  CT images personally reviewed today. B renal stones noted without ureteral calcification or hydroureteronephrosis. Bladder wall thickening noted.  PMH: Past Medical History:  Diagnosis Date   Hyperlipidemia     Surgical History: No past surgical history on file.  Home Medications:  Allergies as of 05/12/2021   No Known Allergies      Medication List    as of May 12, 2021 12:56 PM   You have not been prescribed any medications.     Allergies: No Known Allergies  Family History: Family History  Problem Relation Age of Onset   Pancreatitis Mother    Heart disease Father    Obesity Sister     Social History:  reports that he has never smoked. He has quit using smokeless tobacco. He reports current alcohol use of about 14.0 standard drinks per week. He reports that he does not use drugs.  ROS: All other review of systems were reviewed and are negative except what is noted above in HPI  Physical Exam: BP 126/78    Pulse 62   Constitutional:  Alert and oriented, No acute distress. HEENT: [place] AT, moist mucus membranes.  Trachea midline Cardiovascular: No clubbing, cyanosis, or edema. Respiratory:  Normal respiratory effort, no increased work of breathing. GI: Abdomen is soft, nontender, nondistended, no abdominal masses GU: Right sided CVA tenderness.   Skin: No rashes, bruises or suspicious lesions. Neurologic: Grossly intact, no focal deficits, moving all 4 extremities. Psychiatric: Normal mood and affect.  Laboratory Data: Lab Results  Component Value Date   WBC 4.0 09/09/2019   HGB 15.3 09/09/2019   HCT 44.4 09/09/2019   MCV 93.5 09/09/2019   PLT 230 09/09/2019    Lab Results  Component Value Date   CREATININE 1.40 (H) 07/20/2017    No results found for: PSA  No results found for: TESTOSTERONE  No results found for: HGBA1C  Urinalysis    Component Value Date/Time   COLORURINE YELLOW 07/20/2017 1230   APPEARANCEUR CLEAR 07/20/2017 1230   LABSPEC >1.046 (H) 07/20/2017 1230   PHURINE 7.0 07/20/2017 1230   GLUCOSEU NEGATIVE 07/20/2017 1230   HGBUR NEGATIVE 07/20/2017 [place] 07/20/2017 1230   KETONESUR 20 (A) 07/20/2017 [place] 07/20/2017 1230   NITRITE NEGATIVE 07/20/2017 [place] 07/20/2017 1230    No results found for: LABMICR, [place], RBCUA, LABEPIT, MUCUS, BACTERIA  Pertinent Imaging:  Results for orders placed in visit on 05/11/21  DG Abd 1 View  Narrative CLINICAL DATA:  RIGHT flank pain for 5 days, history of kidney stones  EXAM: ABDOMEN - 1 VIEW  COMPARISON:  CT abdomen and pelvis 07/20/2017  FINDINGS: Three calculi project over RIGHT kidney, largest  4 mm diameter.  No ureteral calcifications or LEFT renal calculi.  Bowel gas pattern normal.  Partial sacralization of LEFT transverse process L5.  No acute osseous abnormalities.  IMPRESSION: Three RIGHT renal calculi without evidence of ureteral calcification.   Electronically Signed By: Lavonia Dana M.D. On: 05/12/2021 10:30    Results for orders placed in visit on 05/12/21  CT RENAL STONE  STUDY  Narrative CLINICAL DATA:  RIGHT flank pain, difficulty urinating, symptoms for 5 days, history of kidney stones  EXAM: CT ABDOMEN AND PELVIS WITHOUT CONTRAST  TECHNIQUE: Multidetector CT imaging of the abdomen and pelvis was performed following the standard protocol without IV contrast.  RADIATION DOSE REDUCTION: This exam was performed according to the departmental dose-optimization program which includes automated exposure control, adjustment of the mA and/or kV according to patient size and/or use of iterative reconstruction technique.  COMPARISON:  07/20/2017  FINDINGS: Lower chest: Lung bases clear  Hepatobiliary: Gallbladder and liver normal appearance  Pancreas: Normal appearance  Spleen: Normal appearance  Adrenals/Urinary Tract: Adrenal glands normal appearance. LEFT renal cyst 3.4 x 2.5 cm previously 2.5 x 2.0 cm. Small BILATERAL nonobstructing renal calculi. No additional renal masses, hydronephrosis, hydroureter, or ureteral calcification. Bladder is only partially distended but the bladder wall appears diffusely thickened. No obstructing calculi identified.  Stomach/Bowel: Normal appendix. Stomach and bowel loops normal appearance.  Vascular/Lymphatic: Aorta normal caliber. Small inferior RIGHT pelvic phleboliths. No vascular abnormalities or adenopathy.  Reproductive: Unremarkable prostate gland and seminal vesicles  Other: No free air or free fluid.  No hernia.  Musculoskeletal: Unremarkable  IMPRESSION: Small BILATERAL nonobstructing renal calculi.  Interval increase in size of a LEFT renal cyst.  Thickened bladder wall suspicious for cystitis; recommend correlation with urinalysis.   Electronically Signed By: Lavonia Dana M.D. On: 05/12/2021 12:38   Assessment & Plan:   1. Kidney stones - Urinalysis, Routine w reflex microscopic  2. Right flank pain - CT RENAL STONE STUDY    Start tamsulosin 0.4mg  daily. Stone prevention and  diet recommendations education given.  [facility]

## 2021-05-12 NOTE — Progress Notes (Signed)
Urological Symptom Review  Patient is experiencing the following symptoms: Trouble starting stream(recently) Kidney stone (history)   Review of Systems  Gastrointestinal (upper)  : Negative for upper GI symptoms  Gastrointestinal (lower) : Negative for lower GI symptoms  Constitutional : Negative for symptoms  Skin: Negative for skin symptoms  Eyes: Negative for eye symptoms  Ear/Nose/Throat : Negative for Ear/Nose/Throat symptoms  Hematologic/Lymphatic: Negative for Hematologic/Lymphatic symptoms  Cardiovascular : Negative for cardiovascular symptoms  Respiratory : Negative for respiratory symptoms  Endocrine: Negative for endocrine symptoms  Musculoskeletal: Back pain  Neurological: Negative for neurological symptoms  Psychologic: Negative for psychiatric symptoms

## 2021-06-21 ENCOUNTER — Other Ambulatory Visit (HOSPITAL_COMMUNITY): Payer: Self-pay

## 2021-06-22 ENCOUNTER — Other Ambulatory Visit (HOSPITAL_COMMUNITY): Payer: Self-pay

## 2021-06-22 MED ORDER — TAMSULOSIN HCL 0.4 MG PO CAPS
0.4000 mg | ORAL_CAPSULE | Freq: Every day | ORAL | 2 refills | Status: DC
  Filled 2021-08-01: qty 30, 30d supply, fill #0
  Filled 2021-09-18: qty 30, 30d supply, fill #1

## 2021-08-01 ENCOUNTER — Other Ambulatory Visit (HOSPITAL_COMMUNITY): Payer: Self-pay

## 2021-09-18 ENCOUNTER — Other Ambulatory Visit (HOSPITAL_COMMUNITY): Payer: Self-pay

## 2021-11-08 ENCOUNTER — Ambulatory Visit: Payer: No Typology Code available for payment source | Admitting: Urology

## 2021-11-23 ENCOUNTER — Other Ambulatory Visit (HOSPITAL_COMMUNITY): Payer: Self-pay

## 2021-11-23 ENCOUNTER — Other Ambulatory Visit: Payer: Self-pay | Admitting: Urology

## 2021-11-25 MED ORDER — TAMSULOSIN HCL 0.4 MG PO CAPS
0.4000 mg | ORAL_CAPSULE | Freq: Every day | ORAL | 2 refills | Status: AC
  Filled 2021-11-25: qty 30, 30d supply, fill #0
  Filled 2022-02-06: qty 30, 30d supply, fill #1
  Filled 2022-03-19: qty 30, 30d supply, fill #2

## 2021-11-27 ENCOUNTER — Other Ambulatory Visit (HOSPITAL_COMMUNITY): Payer: Self-pay

## 2021-12-26 ENCOUNTER — Other Ambulatory Visit (HOSPITAL_COMMUNITY): Payer: Self-pay

## 2022-02-06 ENCOUNTER — Other Ambulatory Visit (HOSPITAL_COMMUNITY): Payer: Self-pay

## 2022-03-20 ENCOUNTER — Other Ambulatory Visit (HOSPITAL_COMMUNITY): Payer: Self-pay

## 2022-06-15 ENCOUNTER — Other Ambulatory Visit (HOSPITAL_COMMUNITY): Payer: Self-pay

## 2022-10-15 DIAGNOSIS — E785 Hyperlipidemia, unspecified: Secondary | ICD-10-CM | POA: Diagnosis not present

## 2022-10-15 DIAGNOSIS — Z8659 Personal history of other mental and behavioral disorders: Secondary | ICD-10-CM | POA: Diagnosis not present

## 2022-10-15 DIAGNOSIS — N200 Calculus of kidney: Secondary | ICD-10-CM | POA: Diagnosis not present

## 2022-10-15 DIAGNOSIS — Z125 Encounter for screening for malignant neoplasm of prostate: Secondary | ICD-10-CM | POA: Diagnosis not present

## 2022-10-15 DIAGNOSIS — Z79899 Other long term (current) drug therapy: Secondary | ICD-10-CM | POA: Diagnosis not present

## 2022-10-22 DIAGNOSIS — E785 Hyperlipidemia, unspecified: Secondary | ICD-10-CM | POA: Diagnosis not present

## 2022-10-22 DIAGNOSIS — N200 Calculus of kidney: Secondary | ICD-10-CM | POA: Diagnosis not present

## 2022-10-22 DIAGNOSIS — Z0001 Encounter for general adult medical examination with abnormal findings: Secondary | ICD-10-CM | POA: Diagnosis not present

## 2022-10-23 ENCOUNTER — Encounter (INDEPENDENT_AMBULATORY_CARE_PROVIDER_SITE_OTHER): Payer: Self-pay | Admitting: *Deleted

## 2023-04-30 ENCOUNTER — Encounter (INDEPENDENT_AMBULATORY_CARE_PROVIDER_SITE_OTHER): Payer: Self-pay | Admitting: *Deleted

## 2023-05-03 DIAGNOSIS — H5213 Myopia, bilateral: Secondary | ICD-10-CM | POA: Diagnosis not present

## 2023-10-01 IMAGING — CT CT RENAL STONE PROTOCOL
2 of 4 series · 16 of 46 positions shown, 18 images · non-contrast
Comparison: 07/20/2017

CLINICAL DATA: RIGHT flank pain, difficulty urinating, symptoms for
5 days, history of kidney stones



[Series 2: axial st · axial · 0.82mm/px · z∈[-472,-57]mm · 13 of 93 slices shown, 15 images]
[im 5/93  soft-tissue]
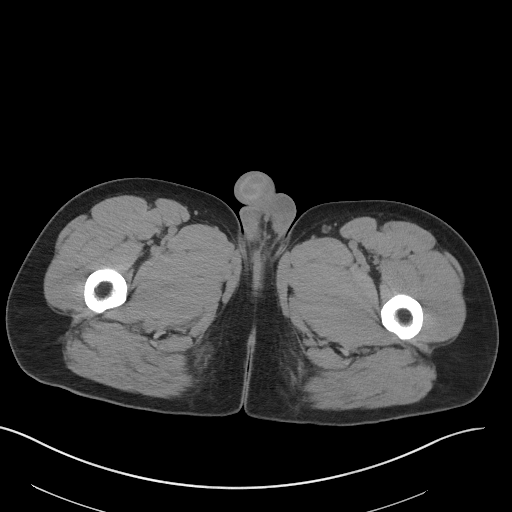
[im 5/93  bone]
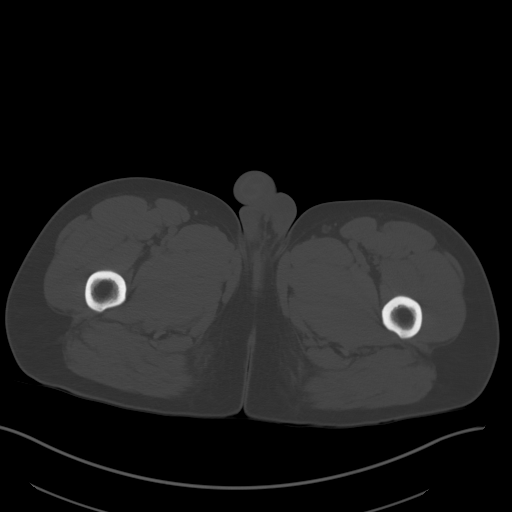
[im 14/93  soft-tissue]
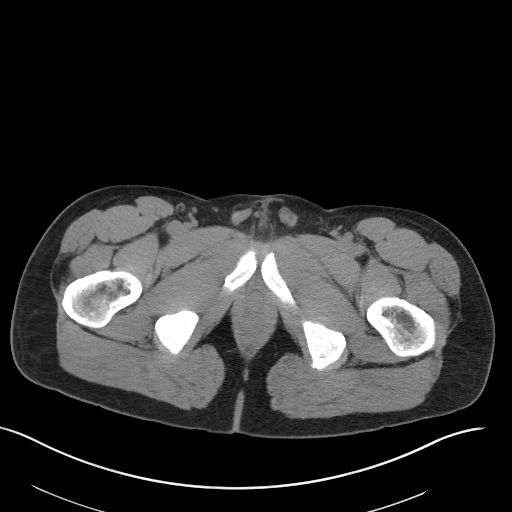
[im 19/93  soft-tissue]
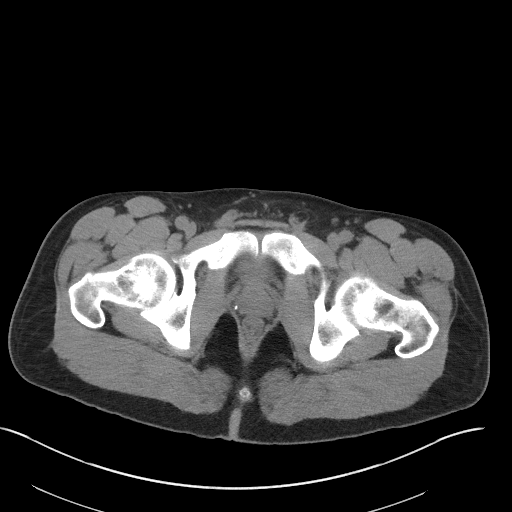
[im 28/93  soft-tissue]
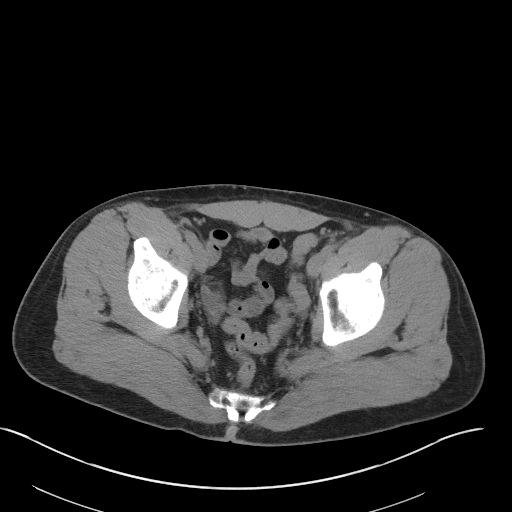
[im 33/93  soft-tissue]
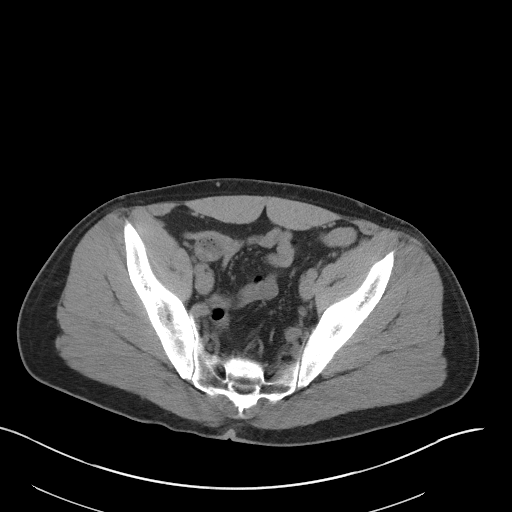
[im 42/93  soft-tissue]
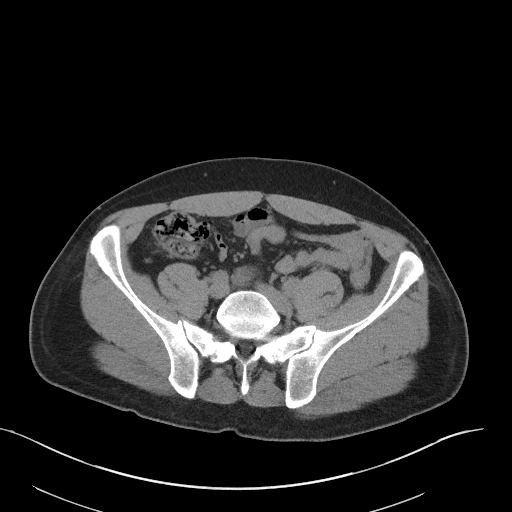
[im 47/93  soft-tissue]
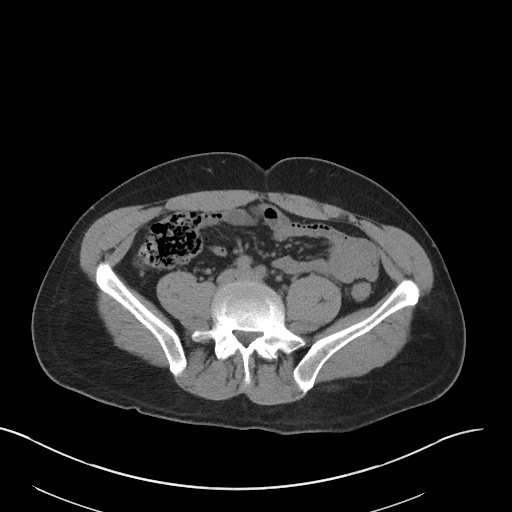
[im 51/93  soft-tissue]
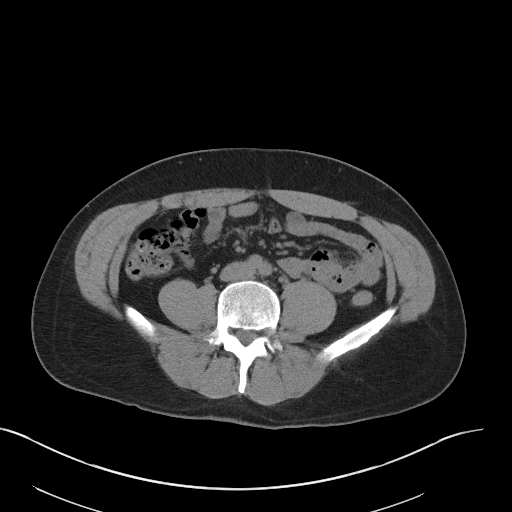
[im 60/93  soft-tissue]
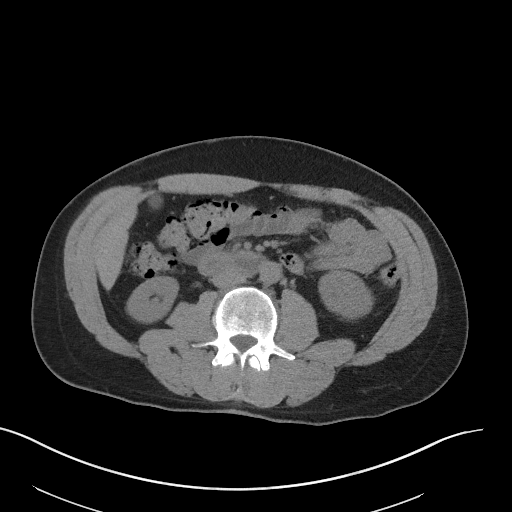
[im 60/93  bone]
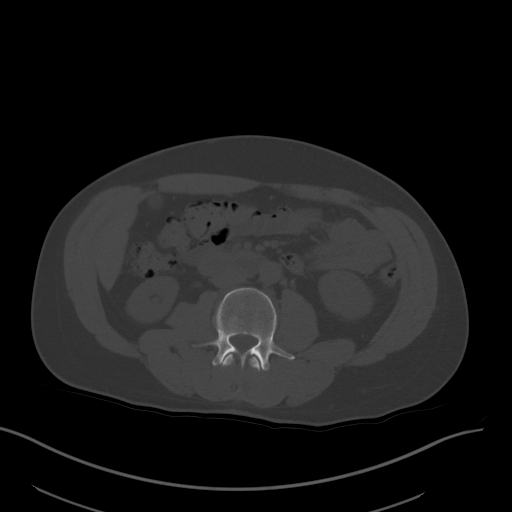
[im 65/93  soft-tissue]
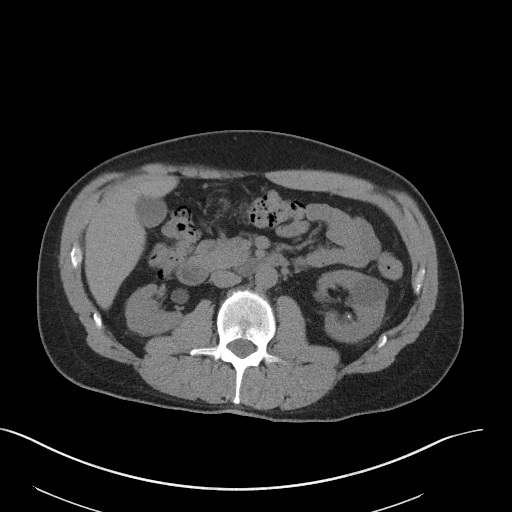
[im 74/93  soft-tissue]
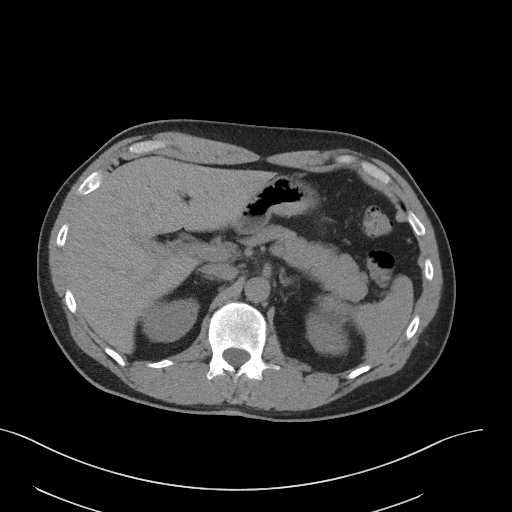
[im 79/93  soft-tissue]
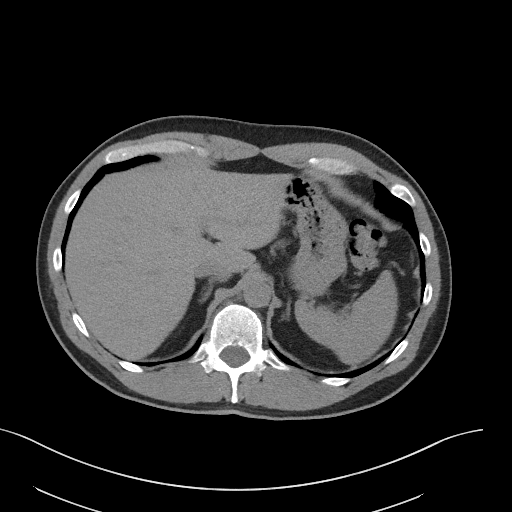
[im 88/93  soft-tissue]
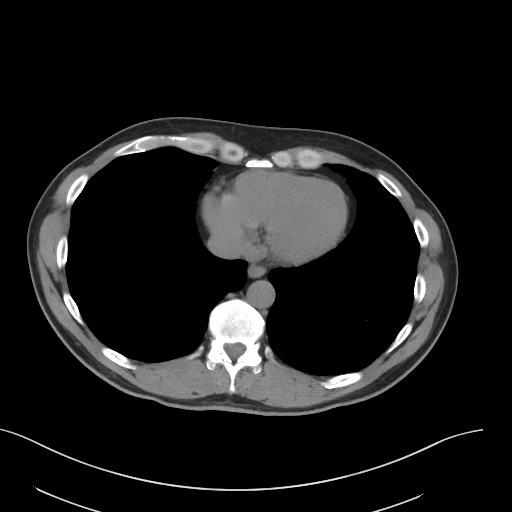

[Series 5: coronal st · coronal · 0.77mm/px · 3 of 94 slices shown]
[im 32/94  soft-tissue]
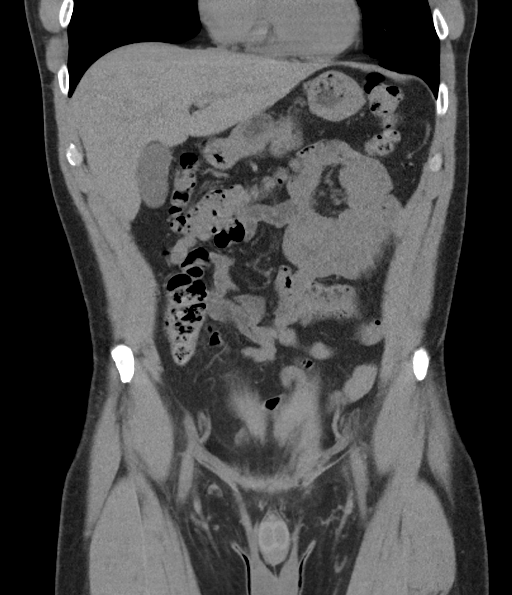
[im 42/94  soft-tissue]
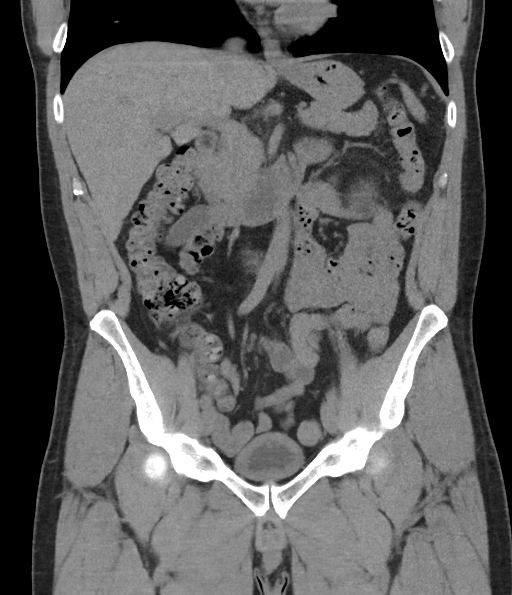
[im 52/94  soft-tissue]
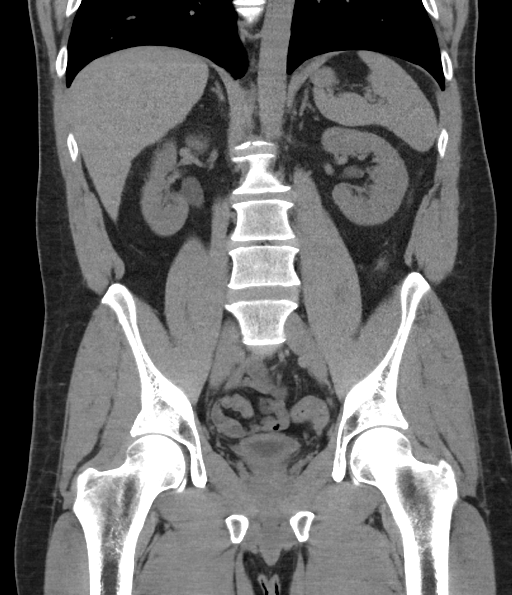

[16 of 46 positions shown; findings below may reference images not displayed]

FINDINGS: Lower chest: Lung bases clear

Hepatobiliary: Gallbladder and liver normal appearance

Pancreas: Normal appearance

Spleen: Normal appearance

Adrenals/Urinary Tract: Adrenal glands normal appearance. LEFT renal
cyst 3.4 x 2.5 cm previously 2.5 x 2.0 cm. Small BILATERAL
nonobstructing renal calculi. No additional renal masses,
hydronephrosis, hydroureter, or ureteral calcification. Bladder is
only partially distended but the bladder wall appears diffusely
thickened. No obstructing calculi identified.

Stomach/Bowel: Normal appendix. Stomach and bowel loops normal
appearance.

Vascular/Lymphatic: Aorta normal caliber. Small inferior RIGHT
pelvic phleboliths. No vascular abnormalities or adenopathy.

Reproductive: Unremarkable prostate gland and seminal vesicles

Other: No free air or free fluid.  No hernia.

Musculoskeletal: Unremarkable
IMPRESSION: Small BILATERAL nonobstructing renal calculi.

Interval increase in size of a LEFT renal cyst.

Thickened bladder wall suspicious for cystitis; recommend
correlation with urinalysis.

## 2023-11-14 DIAGNOSIS — E785 Hyperlipidemia, unspecified: Secondary | ICD-10-CM | POA: Diagnosis not present

## 2023-11-14 DIAGNOSIS — E875 Hyperkalemia: Secondary | ICD-10-CM | POA: Diagnosis not present

## 2023-11-14 DIAGNOSIS — F1011 Alcohol abuse, in remission: Secondary | ICD-10-CM | POA: Diagnosis not present

## 2023-11-14 DIAGNOSIS — Z125 Encounter for screening for malignant neoplasm of prostate: Secondary | ICD-10-CM | POA: Diagnosis not present

## 2023-11-14 DIAGNOSIS — N200 Calculus of kidney: Secondary | ICD-10-CM | POA: Diagnosis not present

## 2024-01-13 ENCOUNTER — Other Ambulatory Visit (HOSPITAL_COMMUNITY): Payer: Self-pay | Admitting: Internal Medicine

## 2024-01-13 DIAGNOSIS — N189 Chronic kidney disease, unspecified: Secondary | ICD-10-CM | POA: Diagnosis not present

## 2024-01-13 DIAGNOSIS — R944 Abnormal results of kidney function studies: Secondary | ICD-10-CM | POA: Diagnosis not present

## 2024-01-13 DIAGNOSIS — Z87442 Personal history of urinary calculi: Secondary | ICD-10-CM | POA: Diagnosis not present

## 2024-01-13 DIAGNOSIS — R7989 Other specified abnormal findings of blood chemistry: Secondary | ICD-10-CM

## 2024-01-24 ENCOUNTER — Ambulatory Visit (HOSPITAL_COMMUNITY)
Admission: RE | Admit: 2024-01-24 | Discharge: 2024-01-24 | Disposition: A | Discharge status: Home / Self Care | Source: Ambulatory Visit | Attending: Internal Medicine | Admitting: Internal Medicine

## 2024-01-24 DIAGNOSIS — R7989 Other specified abnormal findings of blood chemistry: Secondary | ICD-10-CM | POA: Insufficient documentation

## 2024-01-24 DIAGNOSIS — N281 Cyst of kidney, acquired: Secondary | ICD-10-CM | POA: Diagnosis not present
